# Patient Record
Sex: Female | Born: 1995 | Race: White | Hispanic: No | Marital: Single | Smoking: Never smoker
Health system: Southern US, Community
[De-identification: ages and names within clinical notes are randomized; demographics above are authoritative.]

## PROBLEM LIST (undated history)

## (undated) DIAGNOSIS — J45909 Unspecified asthma, uncomplicated: Secondary | ICD-10-CM

---

## 2016-06-16 ENCOUNTER — Encounter: Payer: Self-pay | Admitting: Emergency Medicine

## 2016-06-16 ENCOUNTER — Emergency Department
Admission: EM | Admit: 2016-06-16 | Discharge: 2016-06-16 | Disposition: A | Discharge status: Home / Self Care | Payer: Managed Care, Other (non HMO) | Attending: Emergency Medicine | Admitting: Emergency Medicine

## 2016-06-16 DIAGNOSIS — K529 Noninfective gastroenteritis and colitis, unspecified: Secondary | ICD-10-CM | POA: Diagnosis not present

## 2016-06-16 DIAGNOSIS — J45909 Unspecified asthma, uncomplicated: Secondary | ICD-10-CM | POA: Insufficient documentation

## 2016-06-16 DIAGNOSIS — R112 Nausea with vomiting, unspecified: Secondary | ICD-10-CM | POA: Diagnosis present

## 2016-06-16 HISTORY — DX: Unspecified asthma, uncomplicated: J45.909

## 2016-06-16 LAB — CBC
HEMATOCRIT: 45.3 % (ref 35.0–47.0)
HEMOGLOBIN: 15.3 g/dL (ref 12.0–16.0)
MCH: 29.7 pg (ref 26.0–34.0)
MCHC: 33.7 g/dL (ref 32.0–36.0)
MCV: 88.2 fL (ref 80.0–100.0)
Platelets: 267 10*3/uL (ref 150–440)
RBC: 5.13 MIL/uL (ref 3.80–5.20)
RDW: 12.8 % (ref 11.5–14.5)
WBC: 14 10*3/uL — ABNORMAL HIGH (ref 3.6–11.0)

## 2016-06-16 LAB — URINALYSIS COMPLETE WITH MICROSCOPIC (ARMC ONLY)
BACTERIA UA: NONE SEEN
Bilirubin Urine: NEGATIVE
Glucose, UA: NEGATIVE mg/dL
Leukocytes, UA: NEGATIVE
NITRITE: NEGATIVE
PROTEIN: 100 mg/dL — AB
SPECIFIC GRAVITY, URINE: 1.027 (ref 1.005–1.030)
pH: 5 (ref 5.0–8.0)

## 2016-06-16 LAB — COMPREHENSIVE METABOLIC PANEL
ALBUMIN: 5.3 g/dL — AB (ref 3.5–5.0)
ALT: 13 U/L — ABNORMAL LOW (ref 14–54)
ANION GAP: 9 (ref 5–15)
AST: 19 U/L (ref 15–41)
Alkaline Phosphatase: 70 U/L (ref 38–126)
BILIRUBIN TOTAL: 0.7 mg/dL (ref 0.3–1.2)
BUN: 12 mg/dL (ref 6–20)
CHLORIDE: 105 mmol/L (ref 101–111)
CO2: 22 mmol/L (ref 22–32)
Calcium: 9.8 mg/dL (ref 8.9–10.3)
Creatinine, Ser: 0.78 mg/dL (ref 0.44–1.00)
GFR calc Af Amer: >60 mL/min (ref >60)
GFR calc non Af Amer: >60 mL/min (ref >60)
GLUCOSE: 96 mg/dL (ref 65–99)
POTASSIUM: 3.4 mmol/L — AB (ref 3.5–5.1)
SODIUM: 136 mmol/L (ref 135–145)
TOTAL PROTEIN: 9.1 g/dL — AB (ref 6.5–8.1)

## 2016-06-16 LAB — LIPASE, BLOOD: LIPASE: 24 U/L (ref 11–51)

## 2016-06-16 LAB — PREGNANCY, URINE: PREG TEST UR: NEGATIVE

## 2016-06-16 MED ORDER — SODIUM CHLORIDE 0.9 % IV BOLUS (SEPSIS)
1000.0000 mL | Freq: Once | INTRAVENOUS | Status: AC
  Administered 2016-06-16: 1000 mL via INTRAVENOUS

## 2016-06-16 MED ORDER — ONDANSETRON HCL 4 MG/2ML IJ SOLN
4.0000 mg | Freq: Once | INTRAMUSCULAR | Status: AC
  Administered 2016-06-16: 4 mg via INTRAVENOUS
  Filled 2016-06-16: qty 2

## 2016-06-16 MED ORDER — ONDANSETRON HCL 4 MG PO TABS: 4.0000 mg | ORAL_TABLET | Freq: Three times a day (TID) | ORAL | 0 refills | Status: AC | PRN

## 2016-06-16 NOTE — ED Notes (Signed)
Pt unable to provide urine at this time. Pt given empty urine container and instructions on collection when pt ready.

## 2016-06-16 NOTE — ED Triage Notes (Signed)
Pt ambulatory to triage with steady gait, no distress noted. Pt c/o N/V/D x2 days. Pt reports she has been seen for the same symptoms twice in the last 2 years. Pt states she has not had a diagnosis but these episodes happen when she eats "fatty foods" and states she did not eat "well" over the weekend.

## 2016-06-16 NOTE — ED Provider Notes (Addendum)
[facility] Emergency Department Provider Note  ____________________________________________   I have reviewed the triage vital signs and the nursing notes.   HISTORY  Chief Complaint Emesis; Diarrhea; and Abdominal Pain    HPI Rhonda Hines is a 20 y.o. female with a history of a "sensitive stomach" presents today complaining of nausea vomiting and diarrhea. Patient states she was on a road trip and ate in many different restaurants and then this morning at 4 AM started having nausea vomiting diarrhea. His of multiple episodes since then. Has diffuse abdominal cramping no focal abdominal pain. Denies fever or chills. Denies pregnancy is on her menstrual period at this time. Denies melena denies hematemesis. Denies sick contacts. Denies recent overseas travel. Denies recent antibiotics.       Past Medical History:  Diagnosis Date  . Asthma     There are no active problems to display for this patient.   History reviewed. No pertinent surgical history.  Prior to Admission medications   Not on File    Allergies Review of patient's allergies indicates no known allergies.  History reviewed. No pertinent family history.  Social History Social History  Substance Use Topics  . Smoking status: Never Smoker  . Smokeless tobacco: Never Used  . Alcohol use Yes    Review of Systems Constitutional: No fever/chills Eyes: No visual changes. ENT: No sore throat. No stiff neck no neck pain Cardiovascular: Denies chest pain. Respiratory: Denies shortness of breath. Gastrointestinal:  See history of present illness. Genitourinary: Negative for dysuria. Musculoskeletal: Negative lower extremity swelling Skin: Negative for rash. Neurological: Negative for severe headaches, focal weakness or numbness. 10-point ROS otherwise negative.  ____________________________________________   PHYSICAL EXAM:  VITAL SIGNS: ED Triage Vitals  Enc Vitals  Group     BP 06/16/16 2010 122/84     Pulse Rate 06/16/16 2010 68     Resp 06/16/16 2010 15     Temp 06/16/16 2010 98.3 F (36.8 C)     Temp Source 06/16/16 2010 Oral     SpO2 06/16/16 2010 99 %     Weight 06/16/16 2011 130 lb (59 kg)     Height 06/16/16 2011 5\' 9"  (1.753 m)     Head Circumference --      Peak Flow --      Pain Score 06/16/16 2011 4     Pain Loc --      Pain Edu? --      Excl. in GC? --     Constitutional: Alert and oriented. Well appearing and in no acute distress. Eyes: Conjunctivae are normal. PERRL. EOMI. Head: Atraumatic. Nose: No congestion/rhinnorhea. Mouth/Throat: Mucous membranes are moist.  Oropharynx non-erythematous. Neck: No stridor.   Nontender with no meningismus Cardiovascular: Normal rate, regular rhythm. Grossly normal heart sounds.  Good peripheral circulation. Respiratory: Normal respiratory effort.  No retractions. Lungs CTAB. Abdominal: Soft and Slight diffuse tenderness with no focality noted specifically, no focal pain to the right upper right lower quadrant. No focal pain to the left lower quadrant either.. No distention. No guarding no rebound Back:  There is no focal tenderness or step off.  there is no midline tenderness there are no lesions noted. there is no CVA tenderness Musculoskeletal: No lower extremity tenderness, no upper extremity tenderness. No joint effusions, no DVT signs strong distal pulses no edema Neurologic:  Normal speech and language. No gross focal neurologic deficits are appreciated.  Skin:  Skin is warm, dry and intact. No rash noted.  Psychiatric: Mood and affect are normal. Speech and behavior are normal.  ____________________________________________   LABS (all labs ordered are listed, but only abnormal results are displayed)  Labs Reviewed  COMPREHENSIVE METABOLIC PANEL - Abnormal; Notable for the following:       Result Value   Potassium 3.4 (*)    Total Protein 9.1 (*)    Albumin 5.3 (*)    ALT 13 (*)     All other components within normal limits  CBC - Abnormal; Notable for the following:    WBC 14.0 (*)    All other components within normal limits  URINALYSIS COMPLETEWITH MICROSCOPIC ([facility] ONLY) - Abnormal; Notable for the following:    Color, Urine YELLOW (*)    APPearance HAZY (*)    Ketones, ur 1+ (*)    Hgb urine dipstick 3+ (*)    Protein, ur 100 (*)    Squamous Epithelial / LPF 0-5 (*)    All other components within normal limits  LIPASE, BLOOD  PREGNANCY, URINE  POC URINE PREG, ED   ____________________________________________  EKG   ____________________________________________  RADIOLOGY  I reviewed any imaging ordered by me or triage that were performed during my shift and, if possible, patient and/or family made aware of any abnormal findings. ____________________________________________   PROCEDURES  Procedure(s) performed: None  Procedures  Critical Care performed: None  ____________________________________________   INITIAL IMPRESSION / ASSESSMENT AND PLAN / ED COURSE  Pertinent labs & imaging results that were available during my care of the patient were reviewed by me and considered in my medical decision making (see chart for details).  Patient with nausea vomiting and diarrhea for less than 24 hours duration. Does not appear to be significantly dehydrated. Does not have focal abdominal pain suggestive of any surgical pathology today. Specifically nothing at this time to suggest ovarian torsion, appendicitis, or disease diverticular disease volvulus etc. White count is mildly elevated which is not unusual for either viral or bacterial gastroenteritis from food poisoning or other sources. No red flags exist at this time to indicate the need for further imaging and of course the risk of radiation is not insignificant and a 20 year old patient. There is no indication at this time for ultrasound given that her complaint is nausea vomiting and diarrhea.  We will give her IV fluids, antiemetics, check a pregnancy test as a precaution, and see if we can get her feeling better. There is a very large community burden of the same.   ----------------------------------------- 10:16 PM on 06/16/2016 -----------------------------------------  Patient states she feels much better, drinking fluid, serial abdominal exams demonstrated no evidence of focal abdominal discomfort. At this time she has no discomfort at all I can deeply palpate in all tolerance of her abdomen. There is no evidence of acute cholecystitis or biliary colic or appendicitis etc. I have extensively explained to her return precautions and the need for follow-up. She understands that we are open 24 hours a day and if at any time she feels worse in the next few days or ever we encouraged her to return immediately to the emergency department. Patient understands this. She feels much better and is requesting discharge.   Clinical Course   ____________________________________________   FINAL CLINICAL IMPRESSION(S) / ED DIAGNOSES  Final diagnoses:  None      This chart was dictated using voice recognition software.  Despite best efforts to proofread,  errors can occur which can change meaning.      Jeanmarie Plant, MD  06/16/16 2209    Jeanmarie PlantJames A Kailee Essman, MD 06/16/16 2217

## 2016-06-17 ENCOUNTER — Emergency Department
Admission: EM | Admit: 2016-06-17 | Discharge: 2016-06-17 | Disposition: A | Discharge status: Home / Self Care | Payer: Managed Care, Other (non HMO) | Attending: Emergency Medicine | Admitting: Emergency Medicine

## 2016-06-17 ENCOUNTER — Emergency Department: Payer: Managed Care, Other (non HMO)

## 2016-06-17 ENCOUNTER — Encounter: Payer: Self-pay | Admitting: Emergency Medicine

## 2016-06-17 DIAGNOSIS — R197 Diarrhea, unspecified: Secondary | ICD-10-CM | POA: Insufficient documentation

## 2016-06-17 DIAGNOSIS — R112 Nausea with vomiting, unspecified: Secondary | ICD-10-CM | POA: Insufficient documentation

## 2016-06-17 DIAGNOSIS — R1031 Right lower quadrant pain: Secondary | ICD-10-CM | POA: Diagnosis not present

## 2016-06-17 DIAGNOSIS — J45909 Unspecified asthma, uncomplicated: Secondary | ICD-10-CM | POA: Diagnosis not present

## 2016-06-17 DIAGNOSIS — R109 Unspecified abdominal pain: Secondary | ICD-10-CM | POA: Diagnosis present

## 2016-06-17 LAB — COMPREHENSIVE METABOLIC PANEL
ALK PHOS: 57 U/L (ref 38–126)
ALT: 12 U/L — AB (ref 14–54)
ANION GAP: 6 (ref 5–15)
AST: 18 U/L (ref 15–41)
Albumin: 4 g/dL (ref 3.5–5.0)
BUN: 13 mg/dL (ref 6–20)
CALCIUM: 8.7 mg/dL — AB (ref 8.9–10.3)
CO2: 22 mmol/L (ref 22–32)
CREATININE: 0.82 mg/dL (ref 0.44–1.00)
Chloride: 111 mmol/L (ref 101–111)
Glucose, Bld: 102 mg/dL — ABNORMAL HIGH (ref 65–99)
Potassium: 3.8 mmol/L (ref 3.5–5.1)
Sodium: 139 mmol/L (ref 135–145)
Total Bilirubin: 0.9 mg/dL (ref 0.3–1.2)
Total Protein: 6.9 g/dL (ref 6.5–8.1)

## 2016-06-17 LAB — CBC
HCT: 37.8 % (ref 35.0–47.0)
HEMOGLOBIN: 12.9 g/dL (ref 12.0–16.0)
MCH: 30.3 pg (ref 26.0–34.0)
MCHC: 34.2 g/dL (ref 32.0–36.0)
MCV: 88.7 fL (ref 80.0–100.0)
PLATELETS: 191 10*3/uL (ref 150–440)
RBC: 4.27 MIL/uL (ref 3.80–5.20)
RDW: 12.7 % (ref 11.5–14.5)
WBC: 8.6 10*3/uL (ref 3.6–11.0)

## 2016-06-17 MED ORDER — IOPAMIDOL (ISOVUE-300) INJECTION 61%
30.0000 mL | Freq: Once | INTRAVENOUS | Status: AC | PRN
  Administered 2016-06-17: 30 mL via ORAL

## 2016-06-17 MED ORDER — ONDANSETRON HCL 4 MG/2ML IJ SOLN
4.0000 mg | Freq: Once | INTRAMUSCULAR | Status: AC
  Administered 2016-06-17: 4 mg via INTRAVENOUS
  Filled 2016-06-17: qty 2

## 2016-06-17 MED ORDER — DIPHENHYDRAMINE HCL 50 MG/ML IJ SOLN
INTRAMUSCULAR | Status: DC
Start: 2016-06-17 — End: 2016-06-17
  Filled 2016-06-17: qty 1

## 2016-06-17 MED ORDER — IOPAMIDOL (ISOVUE-300) INJECTION 61%
75.0000 mL | Freq: Once | INTRAVENOUS | Status: AC | PRN
  Administered 2016-06-17: 75 mL via INTRAVENOUS

## 2016-06-17 MED ORDER — SODIUM CHLORIDE 0.9 % IV BOLUS (SEPSIS)
1000.0000 mL | Freq: Once | INTRAVENOUS | Status: AC
  Administered 2016-06-17: 1000 mL via INTRAVENOUS

## 2016-06-17 MED ORDER — KETOROLAC TROMETHAMINE 30 MG/ML IJ SOLN
30.0000 mg | Freq: Once | INTRAMUSCULAR | Status: AC
  Administered 2016-06-17: 30 mg via INTRAVENOUS
  Filled 2016-06-17: qty 1

## 2016-06-17 MED ORDER — DIPHENHYDRAMINE HCL 50 MG/ML IJ SOLN
25.0000 mg | Freq: Once | INTRAMUSCULAR | Status: AC
  Administered 2016-06-17: 25 mg via INTRAVENOUS

## 2016-06-17 NOTE — ED Notes (Signed)
Pt completed oral contrast and CT notified

## 2016-06-17 NOTE — ED Notes (Signed)
Pt reports localized red itchy rash to left wrist. No breathing difficulties and no other rash noted, MD made aware.

## 2016-06-17 NOTE — ED Provider Notes (Signed)
[facility] Emergency Department Provider Note   ____________________________________________   First MD Initiated Contact with Patient 06/17/16 [phone]     (approximate)  I have reviewed the triage vital signs and the nursing notes.   HISTORY  Chief Complaint Abdominal Pain    HPI Rhonda Hines is a 20 y.o. female who comes into the hospital today with abdominal pain. The patient was here last night with some vomiting and diarrhea. She received some fluids and nausea medicine and she did feel better. The patient was discharged but told that if she should have any worsening symptoms she should come back. She reports that she went home and went to sleep but when she woke up this morning she was having some shooting pain in her right lower quadrant. She reports that the pain is currently 8-9 out of 10 in intensity. She reports that she's not sure if this could be her gallbladder or cysts on her ovaries. She reports that she's had both in the past. The patient denies any vaginal discharge but is on her period. She is not sexually active. The patient was concerned about her pain although she's had no fevers and the vomiting is improved. The patient is here for evaluation today.   Past Medical History:  Diagnosis Date  . Asthma     There are no active problems to display for this patient.   History reviewed. No pertinent surgical history.  Prior to Admission medications   Medication Sig Start Date End Date Taking? Authorizing Provider  ondansetron (ZOFRAN) 4 MG tablet Take 1 tablet (4 mg total) by mouth every 8 (eight) hours as needed for nausea or vomiting. 06/16/16   Jeanmarie PlantJames A McShane, MD    Allergies Review of patient's allergies indicates no known allergies.  History reviewed. No pertinent family history.  Social History Social History  Substance Use Topics  . Smoking status: Never Smoker  . Smokeless tobacco: Never Used  . Alcohol use Yes     Review of Systems Constitutional: No fever/chills Eyes: No visual changes. ENT: No sore throat. Cardiovascular: Denies chest pain. Respiratory: Denies shortness of breath. Gastrointestinal: abdominal pain.   nausea,  vomiting.  diarrhea.  No constipation. Genitourinary: Negative for dysuria. Musculoskeletal: Negative for back pain. Skin: Negative for rash. Neurological: Negative for headaches, focal weakness or numbness.  10-point ROS otherwise negative.  ____________________________________________   PHYSICAL EXAM:  VITAL SIGNS: ED Triage Vitals [06/17/16 0551]  Enc Vitals Group     BP 98/67     Pulse Rate 69     Resp 14     Temp 98.2 F (36.8 C)     Temp Source Oral     SpO2 100 %     Weight 130 lb (59 kg)     Height 5\' 9"  (1.753 m)     Head Circumference      Peak Flow      Pain Score 8     Pain Loc      Pain Edu?      Excl. in GC?     Constitutional: Alert and oriented. Well appearing and in Moderate distress. Eyes: Conjunctivae are normal. PERRL. EOMI. Head: Atraumatic. Nose: No congestion/rhinnorhea. Mouth/Throat: Mucous membranes are moist.  Oropharynx non-erythematous. Cardiovascular: Normal rate, regular rhythm. Grossly normal heart sounds.  Good peripheral circulation. Respiratory: Normal respiratory effort.  No retractions. Lungs CTAB. Gastrointestinal: Soft With some right lower quadrant pain to palpation. No distention. Positive bowel sounds Musculoskeletal: No lower  extremity tenderness nor edema.   Neurologic:  Normal speech and language.  Skin:  Skin is warm, dry and intact. No rash noted. Psychiatric: Mood and affect are normal. Speech and behavior are normal.  ____________________________________________   LABS (all labs ordered are listed, but only abnormal results are displayed)  Labs Reviewed  COMPREHENSIVE METABOLIC PANEL - Abnormal; Notable for the following:       Result Value   Glucose, Bld 102 (*)    Calcium 8.7 (*)    ALT  12 (*)    All other components within normal limits  CBC   ____________________________________________  EKG  none ____________________________________________  RADIOLOGY  none ____________________________________________   PROCEDURES  Procedure(s) performed: None  Procedures  Critical Care performed: No  ____________________________________________   INITIAL IMPRESSION / ASSESSMENT AND PLAN / ED COURSE  Pertinent labs & imaging results that were available during my care of the patient were reviewed by me and considered in my medical decision making (see chart for details).  This is a 20 year old female who comes into the hospital today with some right lower quadrant abdominal pain. The patient was seen yesterday and diagnosed with gastroenteritis but she reports that the pain came back today. We'll repeat the patient's blood work and sent her for CT to ensure that she does not have any colitis or appendicitis. The patient will receive another bolus of normal saline as well as Zofran.  Clinical Course  Value Comment By Time  CT Abdomen Pelvis W Contrast No appendicitis or other explanation for right lower quadrant pain. Rebecka Apley, MD 09/26 [phone]    Patient's CT scan does not show any appendicitis. After some of the fluid the patient is sitting up in bed and she appears well. She reports that she did have a greenish appearing stool and she was concerned about her gallbladder but again her CT scan does not show any other cause of these symptoms. The patient should follow-up with her primary care physician for further evaluation. She should fill her Zofran prescription and the patient will be discharged home. The patient has no further questions at this time. ____________________________________________   FINAL CLINICAL IMPRESSION(S) / ED DIAGNOSES  Final diagnoses:  Right lower quadrant abdominal pain      NEW MEDICATIONS STARTED DURING THIS VISIT:  New  Prescriptions   No medications on file     Note:  This document was prepared using Dragon voice recognition software and may include unintentional dictation errors.    Rebecka Apley, MD 06/17/16 [phone]

## 2016-06-17 NOTE — ED Notes (Signed)
Pt informed to return if any life threatening symptoms occur.  

## 2016-06-17 NOTE — ED Triage Notes (Signed)
Pt ambulatory to triage with steady gait, no ditress noted. Pt was seen in ED last night and discharged with viral infection. Pt was instructed if her N/V/D worsened once she returned home, to come back to ED. Pt reports she woke up with "excruciating" pain and came back for further evaluation.

## 2016-06-17 NOTE — ED Notes (Signed)
Pt taken to CT.

## 2017-07-05 IMAGING — CT CT ABD-PELV W/ CM
2 of 4 series · 16 of 46 positions shown, 18 images · IV contrast (APPLIED)
Comparison: None.

CLINICAL DATA: Right lower quadrant pain.

EXAM:
CT ABDOMEN AND PELVIS WITH CONTRAST
TECHNIQUE: Multidetector CT imaging of the abdomen and pelvis was performed
using the standard protocol following bolus administration of
intravenous contrast.
CONTRAST:  75mL [id number] IOPAMIDOL ([id number]-JNN) INJECTION 61%

[Series 2: axial st · axial · 0.58mm/px · z∈[-584,-164]mm · 13 of 92 slices shown, 15 images]
[im 4/92  soft-tissue]
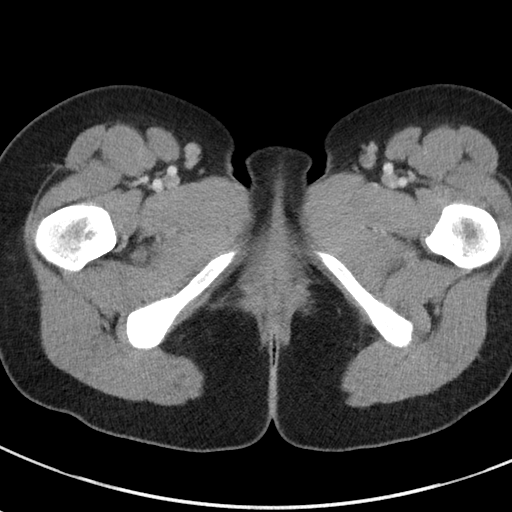
[im 4/92  bone]
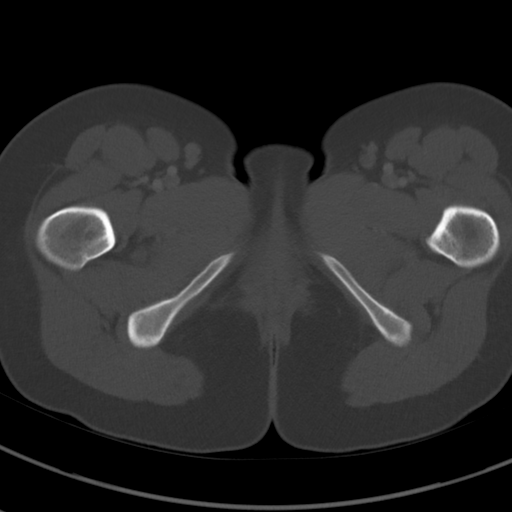
[im 11/92  soft-tissue]
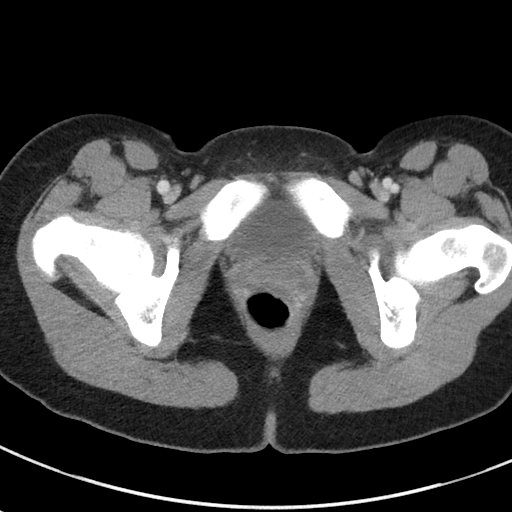
[im 19/92  soft-tissue]
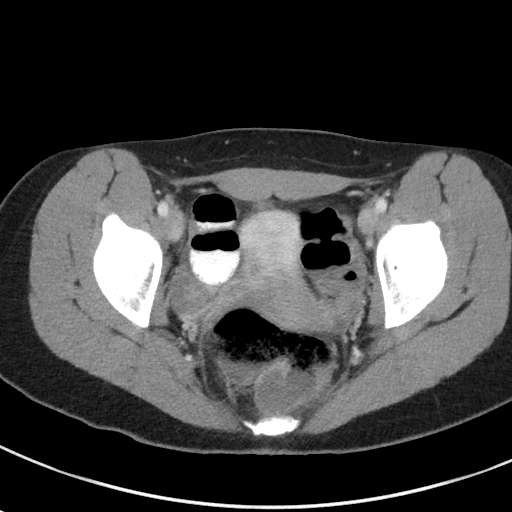
[im 26/92  soft-tissue]
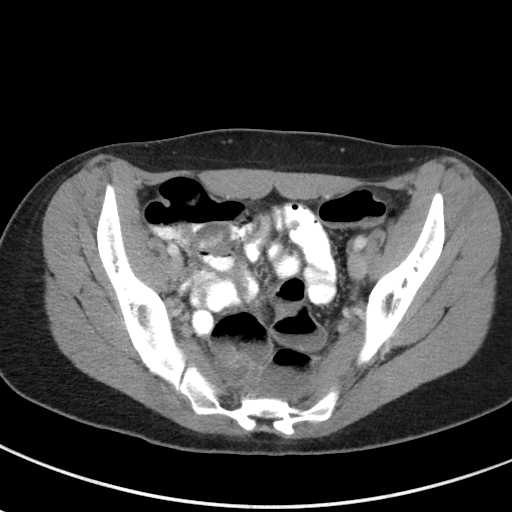
[im 33/92  soft-tissue]
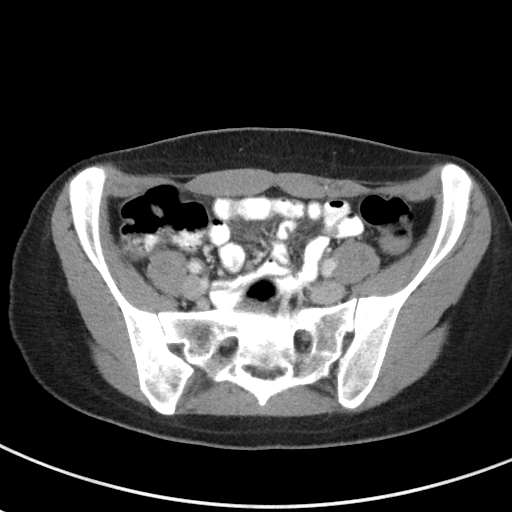
[im 41/92  soft-tissue]
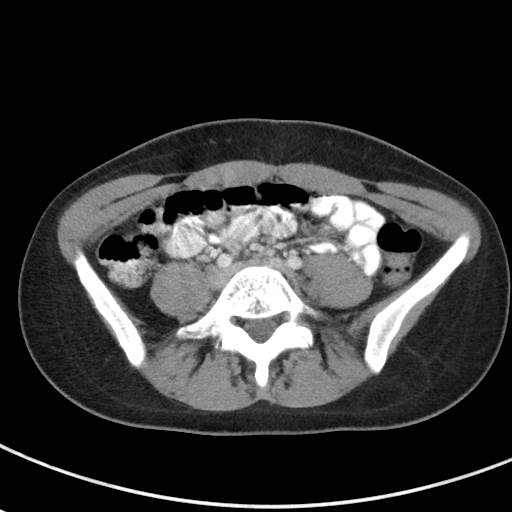
[im 48/92  soft-tissue]
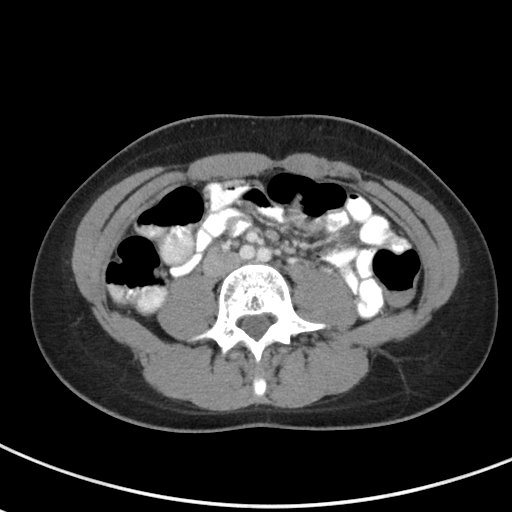
[im 51/92  soft-tissue]
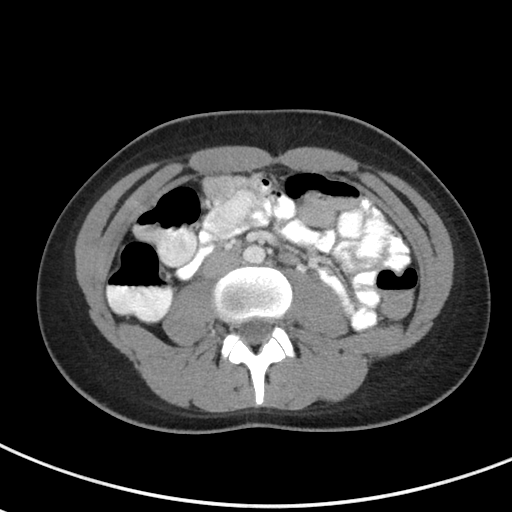
[im 59/92  soft-tissue]
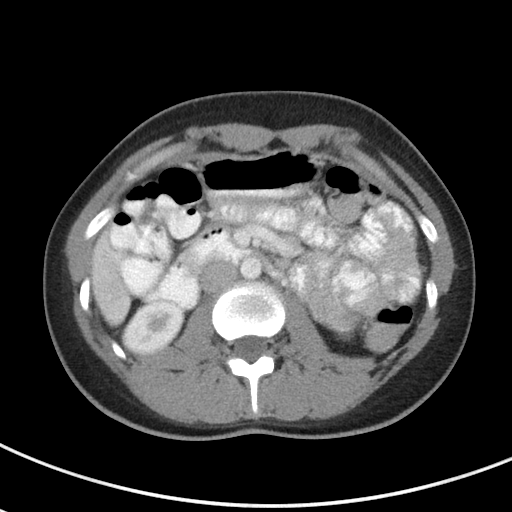
[im 59/92  bone]
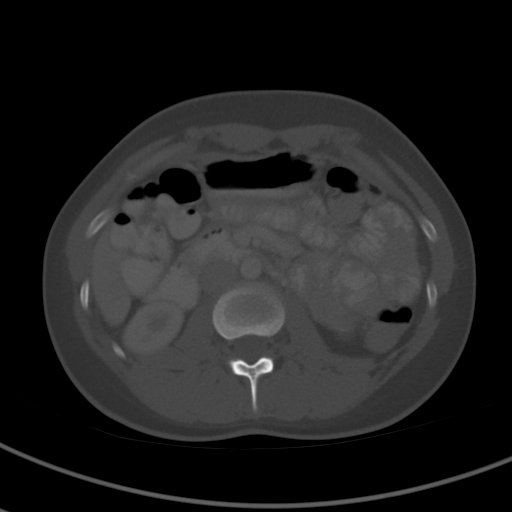
[im 66/92  soft-tissue]
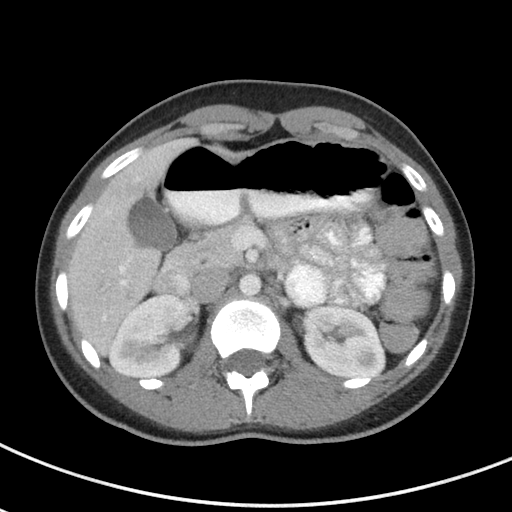
[im 73/92  soft-tissue]
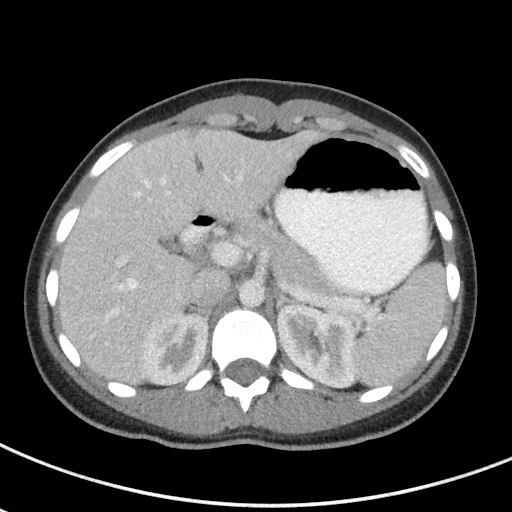
[im 81/92  soft-tissue]
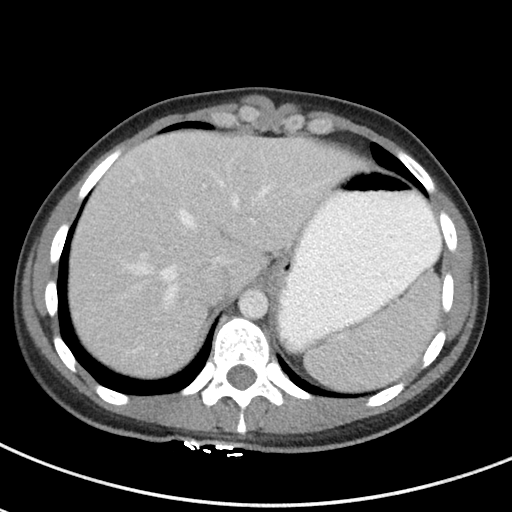
[im 88/92  soft-tissue]
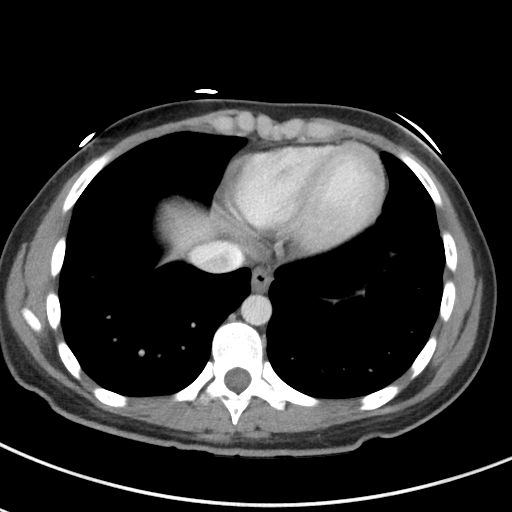

[Series 5: coronal st · coronal · 0.65mm/px · 3 of 75 slices shown]
[im 25/75  soft-tissue]
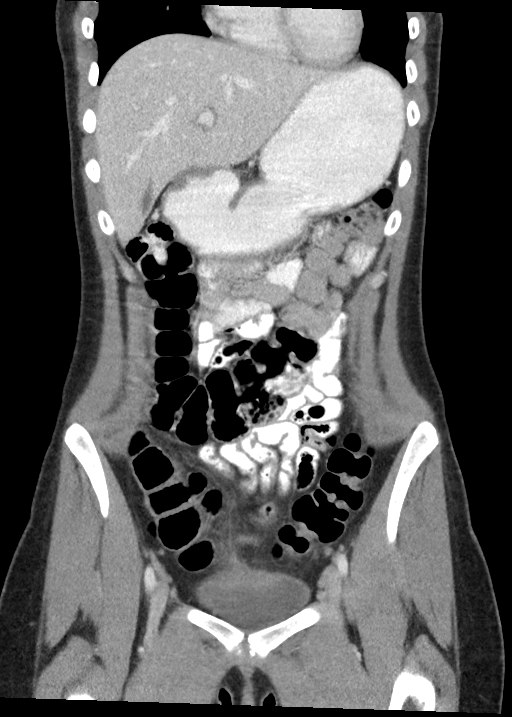
[im 33/75  soft-tissue]
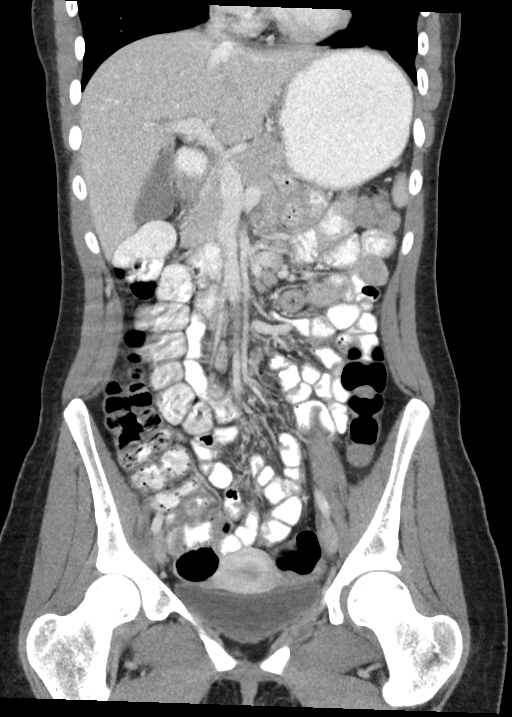
[im 42/75  soft-tissue]
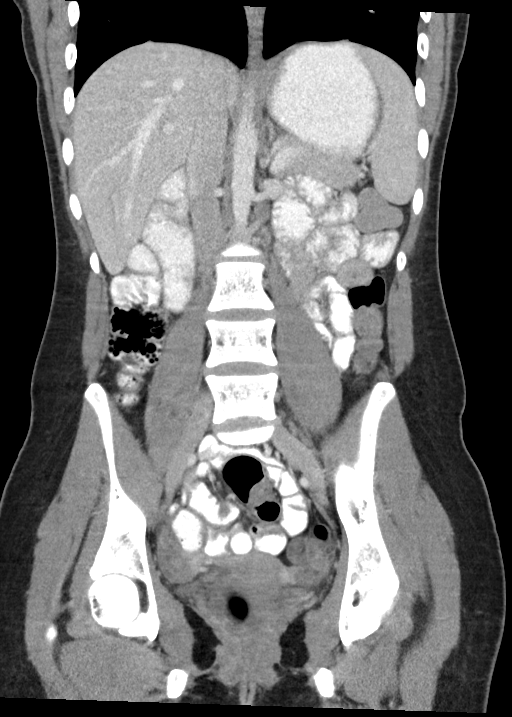

[16 of 46 positions shown; findings below may reference images not displayed]

FINDINGS: Lower chest:  No contributory findings.

Hepatobiliary: No focal liver abnormality.No evidence of biliary
obstruction or stone.

Pancreas: Unremarkable.  Probable divisum anatomy.

Spleen: Unremarkable.

Adrenals/Urinary Tract: Negative adrenals. No hydronephrosis or
stone. Unremarkable bladder.

Stomach/Bowel:  No obstruction. No appendicitis.

Vascular/Lymphatic: No acute vascular abnormality. No mass or
adenopathy.

Reproductive:No pathologic findings.

Other: No ascites or pneumoperitoneum.

Musculoskeletal: No acute abnormalities.
IMPRESSION: No appendicitis or other explanation for right lower quadrant pain.
